# Patient Record
Sex: Female | Born: 1990 | Race: Black or African American | Hispanic: No | Marital: Single | Smoking: Never smoker
Health system: Southern US, Community
[De-identification: ages and names within clinical notes are randomized; demographics above are authoritative.]

---

## 2016-01-05 ENCOUNTER — Ambulatory Visit (INDEPENDENT_AMBULATORY_CARE_PROVIDER_SITE_OTHER): Payer: Federal, State, Local not specified - PPO | Admitting: Family Medicine

## 2016-01-05 ENCOUNTER — Ambulatory Visit (INDEPENDENT_AMBULATORY_CARE_PROVIDER_SITE_OTHER): Payer: Federal, State, Local not specified - PPO

## 2016-01-05 VITALS — BP 106/62 | HR 75 | Temp 97.9°F | Resp 16 | Ht 65.0 in | Wt 257.0 lb

## 2016-01-05 DIAGNOSIS — M940 Chondrocostal junction syndrome [Tietze]: Secondary | ICD-10-CM | POA: Diagnosis not present

## 2016-01-05 DIAGNOSIS — R071 Chest pain on breathing: Secondary | ICD-10-CM

## 2016-01-05 DIAGNOSIS — R0789 Other chest pain: Secondary | ICD-10-CM

## 2016-01-05 DIAGNOSIS — R490 Dysphonia: Secondary | ICD-10-CM | POA: Diagnosis not present

## 2016-01-05 LAB — POCT CBC
Granulocyte percent: 72.9 %G (ref 37–80)
HCT, POC: 38.2 % (ref 37.7–47.9)
HEMOGLOBIN: 13.4 g/dL (ref 12.2–16.2)
Lymph, poc: 2.3 (ref 0.6–3.4)
MCH: 30.6 pg (ref 27–31.2)
MCHC: 35.1 g/dL (ref 31.8–35.4)
MCV: 87.3 fL (ref 80–97)
MID (CBC): 0.7 (ref 0–0.9)
MPV: 6.9 fL (ref 0–99.8)
PLATELET COUNT, POC: 347 10*3/uL (ref 142–424)
POC Granulocyte: 8 — AB (ref 2–6.9)
POC LYMPH PERCENT: 20.5 %L (ref 10–50)
POC MID %: 6.6 %M (ref 0–12)
RBC: 4.37 M/uL (ref 4.04–5.48)
RDW, POC: 13.1 %
WBC: 11 10*3/uL — AB (ref 4.6–10.2)

## 2016-01-05 LAB — POCT SEDIMENTATION RATE: POCT SED RATE: 24 mm/h — AB (ref 0–22)

## 2016-01-05 MED ORDER — OMEPRAZOLE 40 MG PO CPDR: 40.0000 mg | DELAYED_RELEASE_CAPSULE | Freq: Every day | ORAL | Status: DC

## 2016-01-05 MED ORDER — MELOXICAM 15 MG PO TABS: 15.0000 mg | ORAL_TABLET | Freq: Every day | ORAL | Status: DC

## 2016-01-05 MED ORDER — FLUTICASONE PROPIONATE 50 MCG/ACT NA SUSP: 2.0000 | Freq: Every day | NASAL | Status: DC

## 2016-01-05 NOTE — Patient Instructions (Signed)
Because you received an x-ray today, you will receive an invoice from [facility]. Please contact [facility] at [phone] with questions or concerns regarding your invoice. Our billing staff will not be able to assist you with those questions. °

## 2016-01-05 NOTE — Progress Notes (Signed)
Subjective:    Patient ID: Lori Weber, female    DOB: 1991-09-25, 25 y.o.   MRN: [id number] By signing my name below, I, Judithe Modest, attest that this documentation has been prepared under the direction and in the presence of Delman Cheadle, MD. Electronically Signed: Judithe Modest, ER Scribe. 01/05/2016. 1:10 PM.  Chief Complaint  Patient presents with  . Chest Pain    x 2 days, constant, SOB    HPI HPI Comments: Lori Weber is a 25 y.o. female who presents to [facility] complaining sharp mid sternal chest pain worse with deep breathing for the last 48 hours. She did not go to the gym or do any other abnormal activity preceding her pain. She has no past hx of asthma. She has not been on any long distance trips or in bed for an extended period recently. She denies current SOB, palpitations, or cough.  She also states she has a chronically hoarse voice that has not been evaluated previously. She denies heartburn, allergies, or recent URIs. She states her voice has gotten progressively more gravely lately.  History reviewed. No pertinent past medical history.  No Known Allergies  No current outpatient prescriptions on file prior to visit.   No current facility-administered medications on file prior to visit.    Review of Systems  Constitutional: Negative for fever, chills, activity change and appetite change.  HENT: Positive for congestion and voice change. Negative for postnasal drip, rhinorrhea, sinus pressure, sneezing, sore throat and trouble swallowing.   Respiratory: Positive for shortness of breath. Negative for cough, choking, chest tightness, wheezing and stridor.   Cardiovascular: Positive for chest pain. Negative for palpitations and leg swelling.  Gastrointestinal: Negative for abdominal pain and abdominal distention.  Musculoskeletal: Positive for joint swelling. Negative for back pain, gait problem, neck pain and neck stiffness.  Skin: Negative for color change,  pallor, rash and wound.  Allergic/Immunologic: Negative for environmental allergies and food allergies.      Objective:  BP 106/62 mmHg  Pulse 75  Temp(Src) 97.9 F (36.6 C) (Oral)  Resp 16  Ht _0  (1.651 m)  Wt 257 lb (116.574 kg)  BMI 42.77 kg/m2  SpO2 95%  LMP 12/25/2015 (Exact Date)  Physical Exam  Constitutional: She is oriented to person, place, and time. She appears well-developed and well-nourished. No distress.  HENT:  Head: Normocephalic and atraumatic.  Mouth/Throat: Oropharynx is clear and moist. No oropharyngeal exudate.  Gravely voice.  Eyes: Pupils are equal, round, and reactive to light.  Neck: Neck supple.  Cardiovascular: Normal rate, regular rhythm and normal heart sounds.   No murmur heard. Pulmonary/Chest: Effort normal. No respiratory distress. She has no wheezes.  Breath sounds somewhat reduced. Hyperaesthetic tenderness over the right second costosternal junction with palpable swelling in that area. No overlying skin rash and some swelling and tenderness over the right second and right third costosternal junctions.  Musculoskeletal: Normal range of motion. She exhibits tenderness.  Neurological: She is alert and oriented to person, place, and time. Coordination normal.  Skin: Skin is warm and dry. She is not diaphoretic.  No skin changes on chest.  Psychiatric: She has a normal mood and affect. Her behavior is normal.  Nursing note and vitals reviewed.     Results for orders placed or performed in visit on 01/05/16  POCT CBC  Result Value Ref Range   WBC 11.0 (A) 4.6 - 10.2 K/uL   Lymph, poc 2.3 0.6 - 3.4   POC  LYMPH PERCENT 20.5 10 - 50 %L   MID (cbc) 0.7 0 - 0.9   POC MID % 6.6 0 - 12 %M   POC Granulocyte 8.0 (A) 2 - 6.9   Granulocyte percent 72.9 37 - 80 %G   RBC 4.37 4.04 - 5.48 M/uL   Hemoglobin 13.4 12.2 - 16.2 g/dL   HCT, POC 38.2 37.7 - 47.9 %   MCV 87.3 80 - 97 fL   MCH, POC 30.6 27 - 31.2 pg   MCHC 35.1 31.8 - 35.4 g/dL   RDW, POC  13.1 %   Platelet Count, POC 347 142 - 424 K/uL   MPV 6.9 0 - 99.8 fL  POCT SEDIMENTATION RATE  Result Value Ref Range   POCT SED RATE 24 (A) 0 - 22 mm/hr   Dg Chest 2 View  01/05/2016  CLINICAL DATA:  Chest pain and swelling upper RIGHT side, RIGHT second costochondral pain EXAM: CHEST  2 VIEW COMPARISON:  None FINDINGS: Normal heart size, mediastinal contours, and pulmonary vascularity. Lungs clear. No pneumothorax. Bones unremarkable. IMPRESSION: Normal exam. Electronically Signed   By: Lavonia Dana M.D.   On: 01/05/2016 13:52    Assessment & Plan:   1. Costochondral chest pain   2. Hoarseness, chronic - start ppi and nasal steroid empirically for sev wks to r/o silent GERD/LRD and/or PND as cause of sxs, then needs ENT c/s.  3. Tietze's syndrome - very odd - pt has palpable swelling and hyperesthetic tenderness over her right 2nd costosternal junction, more mild sxs over right 3rd and left 2nd, no recent MSK activity, cough, or URI to explain sxs. Cbc, esr and, cxr very reassuring.  Start ice and nsaid, recheck in sev d - sooner if worse. Her symptoms are c/w this tietze's syndrome which fortunately is benign and self-limited.    Orders Placed This Encounter  Procedures  . DG Chest 2 View    Standing Status: Future     Number of Occurrences: 1     Standing Expiration Date: 01/04/2017    Order Specific Question:  Reason for Exam (SYMPTOM  OR DIAGNOSIS REQUIRED)    Answer:  severe pain and swelling at right 2nd costo-chondral junction    Order Specific Question:  Is the patient pregnant?    Answer:  No    Order Specific Question:  Preferred imaging location?    Answer:  External  . Ambulatory referral to ENT    Referral Priority:  Routine    Referral Type:  Consultation    Referral Reason:  Specialty Services Required    Requested Specialty:  Otolaryngology    Number of Visits Requested:  1  . POCT CBC  . POCT SEDIMENTATION RATE    Meds ordered this encounter  Medications  .  DISCONTD: omeprazole (PRILOSEC) 40 MG capsule    Sig: Take 1 capsule (40 mg total) by mouth daily. 30 minutes before dinner    Dispense:  30 capsule    Refill:  1  . DISCONTD: fluticasone (FLONASE) 50 MCG/ACT nasal spray    Sig: Place 2 sprays into both nostrils at bedtime.    Dispense:  16 g    Refill:  2  . meloxicam (MOBIC) 15 MG tablet    Sig: Take 1 tablet (15 mg total) by mouth daily.    Dispense:  30 tablet    Refill:  1  . fluticasone (FLONASE) 50 MCG/ACT nasal spray    Sig: Place 2 sprays into both nostrils  at bedtime.    Dispense:  16 g    Refill:  2  . omeprazole (PRILOSEC) 40 MG capsule    Sig: Take 1 capsule (40 mg total) by mouth daily. 30 minutes before dinner    Dispense:  30 capsule    Refill:  1    I personally performed the services described in this documentation, which was scribed in my presence. The recorded information has been reviewed and considered, and addended by me as needed.  Delman Cheadle, MD MPH

## 2017-02-16 IMAGING — CR DG CHEST 2V
2 series · 2 of 2 positions shown · non-contrast
Comparison: None

CLINICAL DATA: Chest pain and swelling upper RIGHT side, RIGHT
second costochondral pain

EXAM:
CHEST  2 VIEW

[lateral]
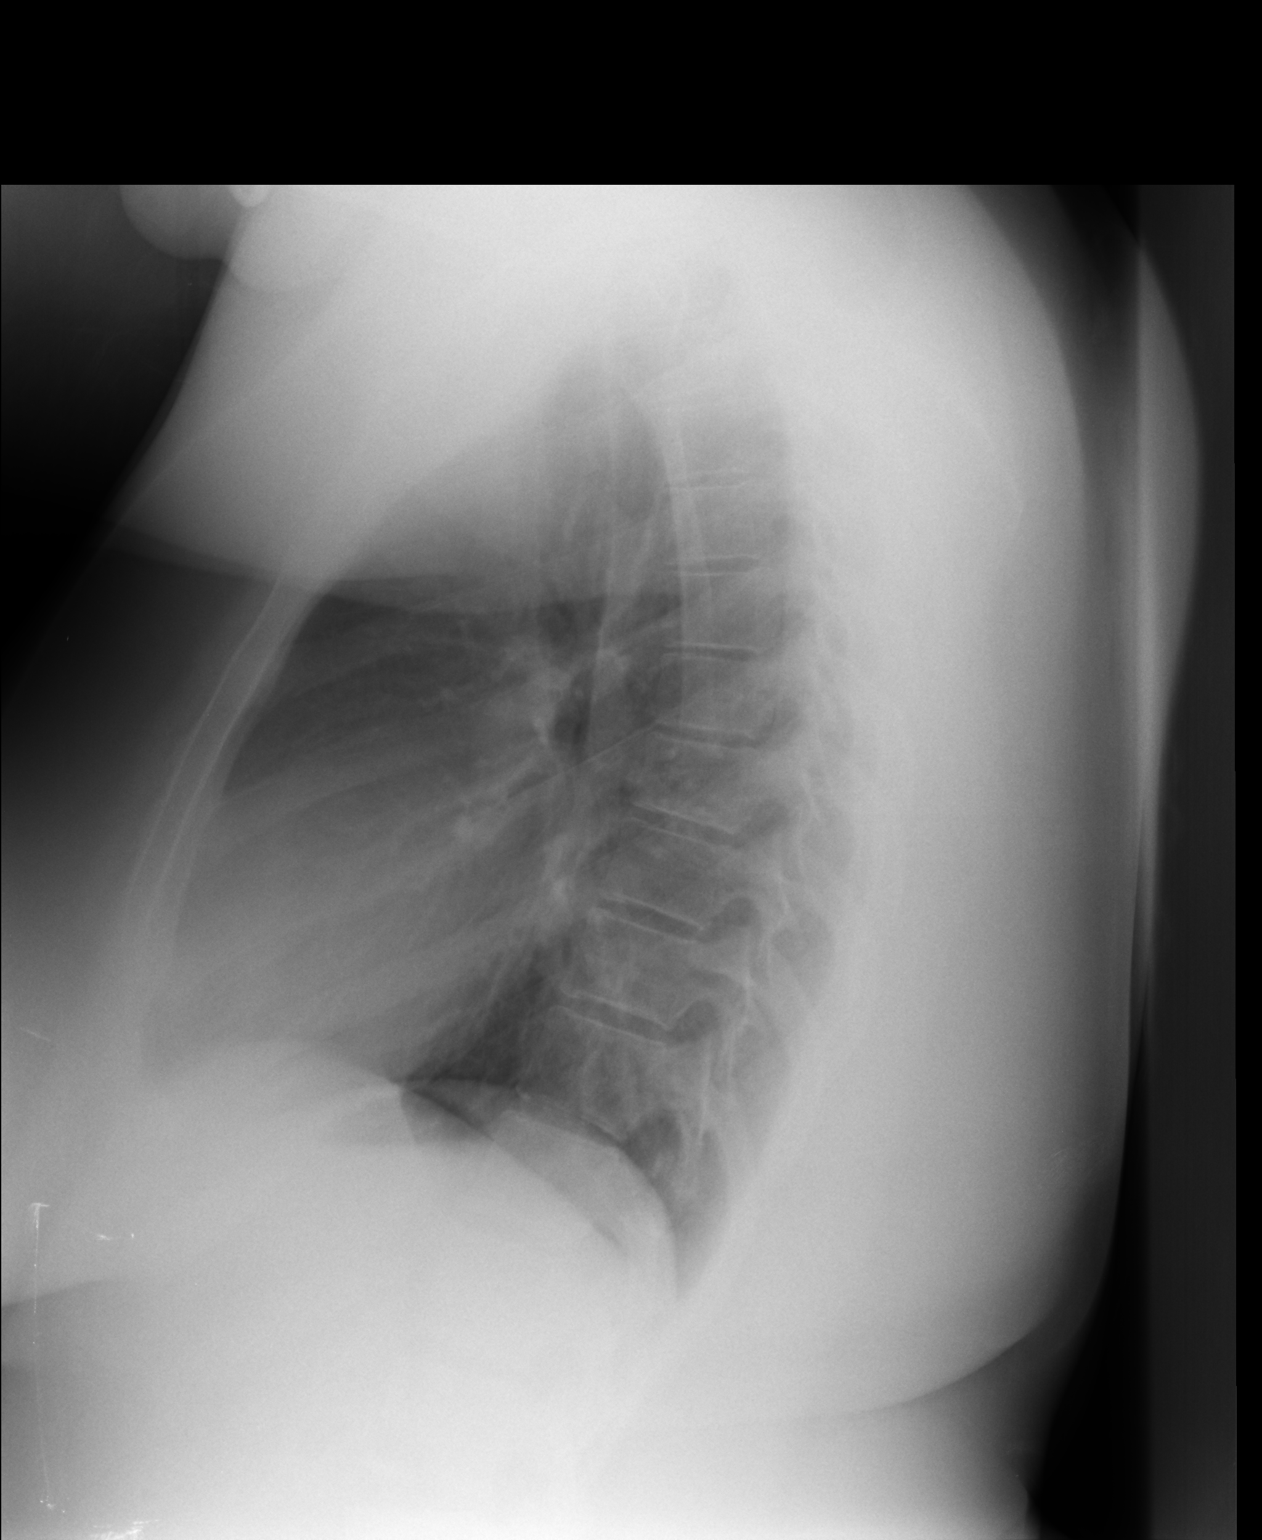

[PA]
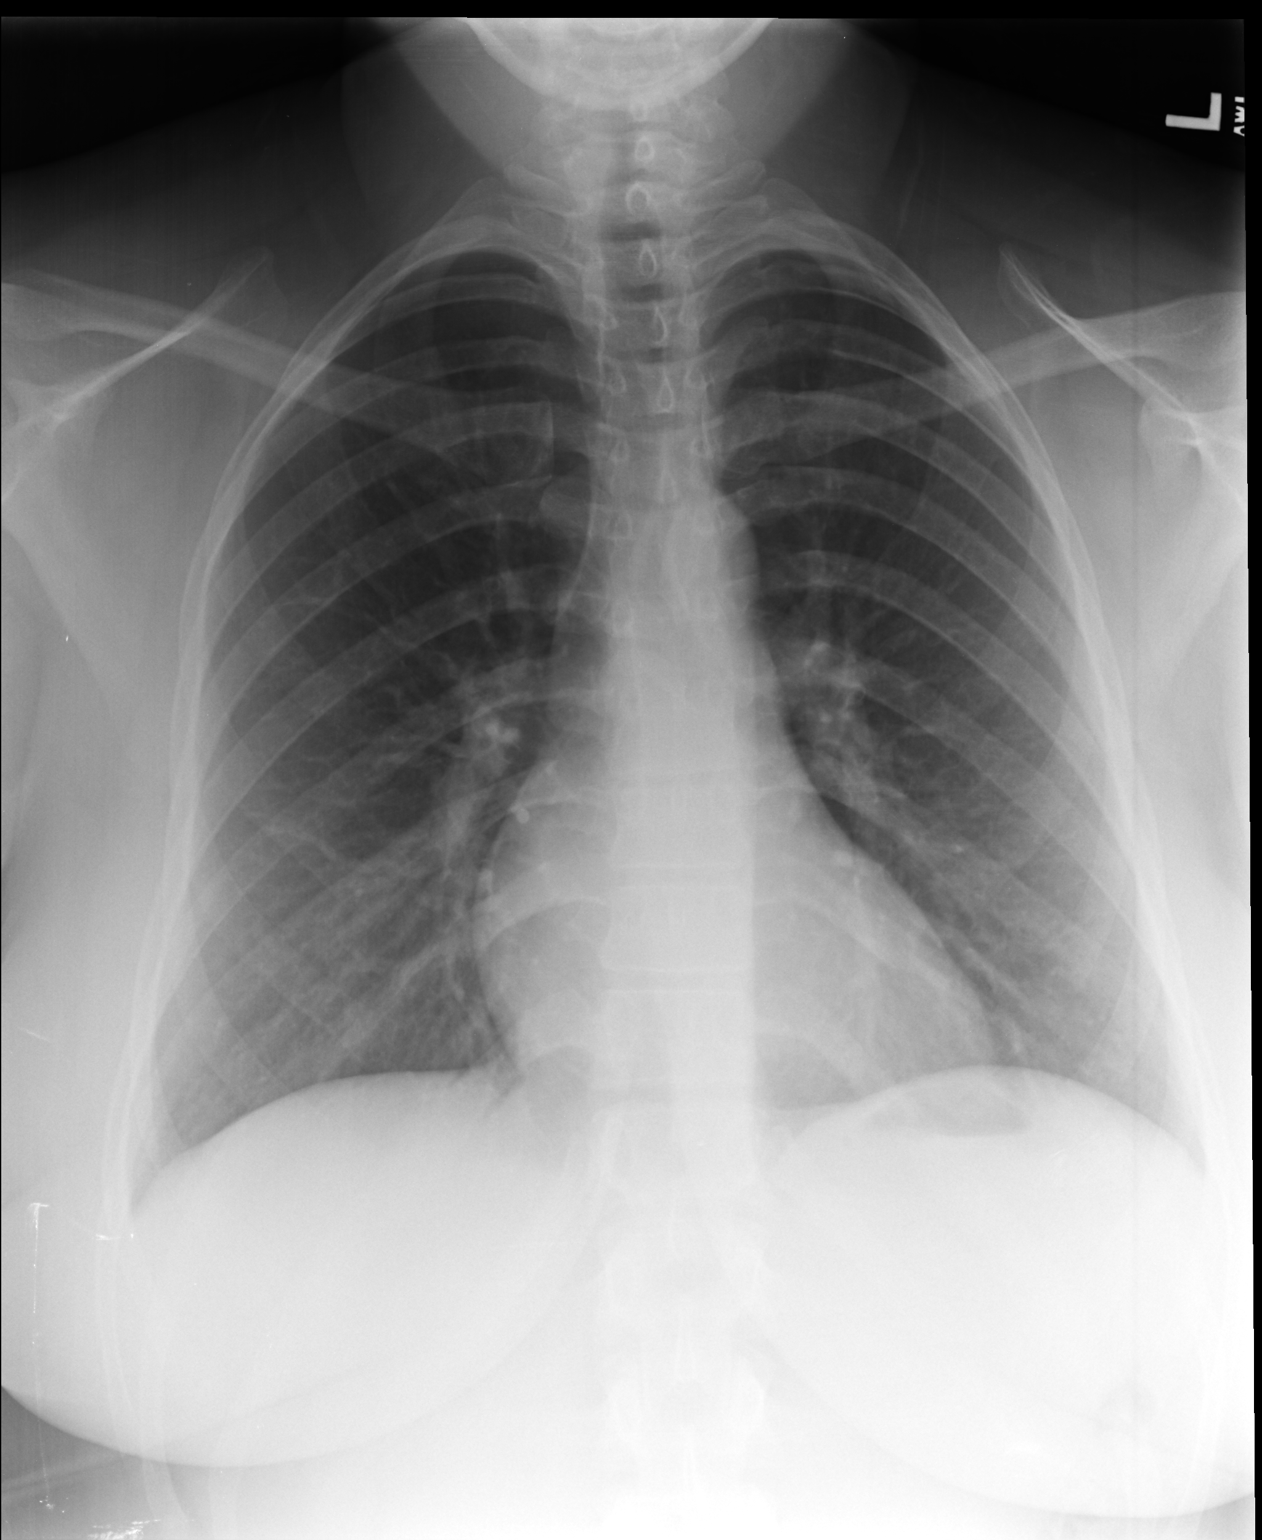

[2 of 2 positions shown; findings below may reference images not displayed]

FINDINGS: Normal heart size, mediastinal contours, and pulmonary vascularity.

Lungs clear.

No pneumothorax.

Bones unremarkable.
IMPRESSION: Normal exam.

## 2017-10-19 ENCOUNTER — Ambulatory Visit (HOSPITAL_COMMUNITY)
Admission: EM | Admit: 2017-10-19 | Discharge: 2017-10-19 | Disposition: A | Discharge status: Home / Self Care | Payer: Federal, State, Local not specified - PPO | Attending: Family Medicine | Admitting: Family Medicine

## 2017-10-19 ENCOUNTER — Encounter (HOSPITAL_COMMUNITY): Payer: Self-pay | Admitting: Emergency Medicine

## 2017-10-19 DIAGNOSIS — Z3202 Encounter for pregnancy test, result negative: Secondary | ICD-10-CM | POA: Diagnosis not present

## 2017-10-19 DIAGNOSIS — R109 Unspecified abdominal pain: Secondary | ICD-10-CM | POA: Insufficient documentation

## 2017-10-19 DIAGNOSIS — N898 Other specified noninflammatory disorders of vagina: Secondary | ICD-10-CM | POA: Diagnosis present

## 2017-10-19 DIAGNOSIS — N760 Acute vaginitis: Secondary | ICD-10-CM | POA: Diagnosis not present

## 2017-10-19 LAB — POCT URINALYSIS DIP (DEVICE)
BILIRUBIN URINE: NEGATIVE
Glucose, UA: NEGATIVE mg/dL
Ketones, ur: NEGATIVE mg/dL
NITRITE: NEGATIVE
PH: 6 (ref 5.0–8.0)
PROTEIN: NEGATIVE mg/dL
Specific Gravity, Urine: 1.025 (ref 1.005–1.030)
UROBILINOGEN UA: 0.2 mg/dL (ref 0.0–1.0)

## 2017-10-19 LAB — POCT PREGNANCY, URINE: Preg Test, Ur: NEGATIVE

## 2017-10-19 MED ORDER — FLUCONAZOLE 150 MG PO TABS: 150.0000 mg | ORAL_TABLET | Freq: Every day | ORAL | 0 refills | Status: AC

## 2017-10-19 NOTE — ED Triage Notes (Signed)
PT C/O: poss yeast inf associated w/itching and creamy/white color vag d/c, abd pain  Sexually active in a monogamous relationship x3 years w/boyfriend   Not concerned for STIs  ONSET: 2 days  DENIES: urinary sx, n/v  TAKING MEDS: none   A&O x4... NAD... Ambulatory

## 2017-10-19 NOTE — Discharge Instructions (Signed)
You were treated empirically for yeast. Start diflucan as directed. Urine culture and cytology sent, you will be contacted with any positive results that requires further treatment. Refrain from sexual activity for the next 7 days. Monitor for any worsening of symptoms, fever, abdominal pain, nausea, vomiting, to follow up for reevaluation.

## 2017-10-19 NOTE — ED Provider Notes (Signed)
[facility]    CSN: [id number] Arrival date & time: 10/19/17  1634     History   Chief Complaint Chief Complaint  Patient presents with  . Vaginitis    HPI Lori Weber is a 26 y.o. female.   26 year old female comes in for 2 day history of vaginal discharge, vaginal itching, abdominal pain. States abdominal pain is cramping in nature and intermittent. Thinks it is related to her cycle, which started this morning. Denies nausea, vomiting. Denies urinary symptoms such as frequency, dysuria, hematuria. Sexually active with 1 partner for the past 3 years, states not concerned about STDs. No condom use. Takes oral birth control. Has not taken anything for the symptoms.        History reviewed. No pertinent past medical history.  There are no active problems to display for this patient.   History reviewed. No pertinent surgical history.  OB History    No data available       Home Medications    Prior to Admission medications   Medication Sig Start Date End Date Taking? Authorizing Provider  Norethindron-Ethinyl Estrad-Fe (TRI-LEGEST FE PO) Take by mouth.   Yes [provider]  fluconazole (DIFLUCAN) 150 MG tablet Take 1 tablet (150 mg total) by mouth daily. Take second dose 72 hours later if symptoms still persists. 10/19/17   Belinda FisherYu, Amy V, PA-C    Family History History reviewed. No pertinent family history.  Social History Social History   Tobacco Use  . Smoking status: Never Smoker  . Smokeless tobacco: Never Used  Substance Use Topics  . Alcohol use: Not on file  . Drug use: Not on file     Allergies   Patient has no known allergies.   Review of Systems Review of Systems  Reason unable to perform ROS: See HPI as above.     Physical Exam Triage Vital Signs ED Triage Vitals [10/19/17 1704]  Enc Vitals Group     BP 138/78     Pulse Rate 67     Resp 20     Temp 98.3 F (36.8 C)     Temp Source Oral     SpO2 100 %   Weight      Height      Head Circumference      Peak Flow      Pain Score      Pain Loc      Pain Edu?      Excl. in GC?    No data found.  Updated Vital Signs BP 138/78 (BP Location: Left Arm)   Pulse 67   Temp 98.3 F (36.8 C) (Oral)   Resp 20   LMP 09/21/2017   SpO2 100%   Physical Exam  Constitutional: She is oriented to person, place, and time. She appears well-developed and well-nourished. No distress.  HENT:  Head: Normocephalic and atraumatic.  Eyes: Conjunctivae are normal. Pupils are equal, round, and reactive to light.  Cardiovascular: Normal rate, regular rhythm and normal heart sounds. Exam reveals no gallop and no friction rub.  No murmur heard. Pulmonary/Chest: Effort normal and breath sounds normal. She has no wheezes. She has no rales.  Abdominal: Soft. Bowel sounds are normal. She exhibits no mass. There is no tenderness. There is no rebound, no guarding and no CVA tenderness.  Genitourinary: Uterus normal. There is no rash, tenderness or lesion on the right labia. There is no rash, tenderness or lesion on the left labia. Uterus  is not tender. Cervix exhibits no motion tenderness and no discharge. Right adnexum displays no mass and no tenderness. Left adnexum displays no mass and no tenderness. There is bleeding in the vagina. Vaginal discharge found.  Neurological: She is alert and oriented to person, place, and time.  Skin: Skin is warm and dry.  Psychiatric: She has a normal mood and affect. Her behavior is normal. Judgment normal.     UC Treatments / Results  Labs (all labs ordered are listed, but only abnormal results are displayed) Labs Reviewed  POCT URINALYSIS DIP (DEVICE) - Abnormal; Notable for the following components:      Result Value   Hgb urine dipstick LARGE (*)    Leukocytes, UA TRACE (*)    All other components within normal limits  URINE CULTURE  POCT PREGNANCY, URINE  URINE CYTOLOGY ANCILLARY ONLY  CERVICOVAGINAL ANCILLARY ONLY     EKG  EKG Interpretation None       Radiology No results found.  Procedures Procedures (including critical care time)  Medications Ordered in UC Medications - No data to display   Initial Impression / Assessment and Plan / UC Course  I have reviewed the triage vital signs and the nursing notes.  Pertinent labs & imaging results that were available during my care of the patient were reviewed by me and considered in my medical decision making (see chart for details).    Patient was treated empirically for yeast. Diflucan as directed. Urine culture and cytology sent, patient will be contacted with any positive results that require additional treatment. Patient to refrain from sexual activity for the next 7 days. Return precautions given.    Final Clinical Impressions(s) / UC Diagnoses   Final diagnoses:  Vaginal discharge    ED Discharge Orders        Ordered    fluconazole (DIFLUCAN) 150 MG tablet  Daily     10/19/17 1732        Belinda FisherYu, Amy V, PA-C 10/19/17 1801

## 2017-10-20 LAB — CERVICOVAGINAL ANCILLARY ONLY
BACTERIAL VAGINITIS: NEGATIVE
CHLAMYDIA, DNA PROBE: NEGATIVE
Candida vaginitis: POSITIVE — AB
NEISSERIA GONORRHEA: NEGATIVE
Trichomonas: NEGATIVE

## 2017-10-20 LAB — URINE CULTURE

## 2017-12-08 ENCOUNTER — Encounter (HOSPITAL_COMMUNITY): Payer: Self-pay | Admitting: Emergency Medicine

## 2017-12-08 ENCOUNTER — Ambulatory Visit (HOSPITAL_COMMUNITY)
Admission: EM | Admit: 2017-12-08 | Discharge: 2017-12-08 | Disposition: A | Discharge status: Home / Self Care | Payer: Federal, State, Local not specified - PPO | Attending: Family Medicine | Admitting: Family Medicine

## 2017-12-08 ENCOUNTER — Ambulatory Visit (INDEPENDENT_AMBULATORY_CARE_PROVIDER_SITE_OTHER): Payer: Federal, State, Local not specified - PPO

## 2017-12-08 DIAGNOSIS — M25512 Pain in left shoulder: Secondary | ICD-10-CM
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
